# Patient Record
Sex: Male | Born: 1995 | Hispanic: No | Marital: Single | Smoking: Current every day smoker
Health system: Southern US, Community
[De-identification: ages and names within clinical notes are randomized; demographics above are authoritative.]

## PROBLEM LIST (undated history)

## (undated) DIAGNOSIS — F419 Anxiety disorder, unspecified: Secondary | ICD-10-CM

## (undated) HISTORY — DX: Anxiety disorder, unspecified: F41.9

---
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
## 2015-01-23 ENCOUNTER — Ambulatory Visit (INDEPENDENT_AMBULATORY_CARE_PROVIDER_SITE_OTHER): Payer: 59 | Admitting: Physician Assistant

## 2015-01-23 VITALS — BP 108/62 | HR 80 | Temp 97.7°F | Resp 16 | Ht 71.0 in | Wt 207.0 lb

## 2015-01-23 DIAGNOSIS — K0889 Other specified disorders of teeth and supporting structures: Secondary | ICD-10-CM | POA: Diagnosis not present

## 2015-01-23 DIAGNOSIS — R41840 Attention and concentration deficit: Secondary | ICD-10-CM

## 2015-01-23 MED ORDER — AMOXICILLIN-POT CLAVULANATE 875-125 MG PO TABS: 1.0000 | ORAL_TABLET | Freq: Two times a day (BID) | ORAL | Status: DC

## 2015-01-23 MED ORDER — IBUPROFEN 800 MG PO TABS: ORAL_TABLET | ORAL | Status: DC

## 2015-01-23 NOTE — Progress Notes (Signed)
  Medical screening examination/treatment/procedure(s) were performed by non-physician practitioner and as supervising physician I was immediately available for consultation/collaboration.     

## 2015-01-23 NOTE — Progress Notes (Signed)
01/23/2015 5:05 PM   DOB: January 22, 1996 / MRN: [id number]  SUBJECTIVE:  Evan Mann is a 19 y.o. male presenting for teeth pain that started two weeks ago and is now severe. The pain is in jaw and eminating from two areas of dental work that he had in June of this year while in [place].  Reports that he is going back in two weeks to have this work completed.  States that he is having a hard time focusing in class 2/2 to pain.  Also reports that he has had attention problems since high school and is interested in taking a medication to help with this.    Also complains of cough preceded by a sore throat, nasal congestion and sore throat.  Denies fever, chills, and nausea.  Has not tried anything for this.    He has No Known Allergies.   He  has a past medical history of Anxiety.    He  reports that he has been smoking.  He does not have any smokeless tobacco history on file. He  has no sexual activity history on file. The patient  has no past surgical history on file.  His family history is not on file.  Review of Systems  Constitutional: Negative for fever and chills.  Eyes: Negative for blurred vision.  Respiratory: Positive for cough. Negative for shortness of breath.   Cardiovascular: Negative for chest pain and leg swelling.  Gastrointestinal: Negative for nausea and abdominal pain.  Genitourinary: Negative for dysuria, urgency and frequency.  Musculoskeletal: Negative for myalgias.  Skin: Negative for rash.  Neurological: Negative for dizziness, tingling and headaches.  Psychiatric/Behavioral: Negative for depression. The patient is not nervous/anxious.     Problem list and medications reviewed and updated by myself where necessary, and exist elsewhere in the encounter.   OBJECTIVE:  BP 108/62 mmHg  Pulse 80  Temp(Src) 97.7 F (36.5 C)  Resp 16  Ht  (1.803 m)  Wt 207 lb (93.895 kg)  BMI 28.88 kg/m2  SpO2 99% CrCl cannot be calculated (Patient has no serum  creatinine result on file.).  Physical Exam  Constitutional: He is oriented to person, place, and time. He appears well-developed. He does not appear ill.  Eyes: Conjunctivae and EOM are normal. Pupils are equal, round, and reactive to light.  Cardiovascular: Normal rate and regular rhythm.   Pulmonary/Chest: Effort normal and breath sounds normal.  Abdominal: Soft. He exhibits no distension.  Musculoskeletal: Normal range of motion.  Neurological: He is alert and oriented to person, place, and time. No cranial nerve deficit. Coordination normal.  Skin: Skin is warm and dry. He is not diaphoretic.  Psychiatric: He has a normal mood and affect.  Nursing note and vitals reviewed.   No results found for this or any previous visit (from the past 48 hour(s)).  ASSESSMENT AND PLAN  Evan Mann was seen today for dental pain, cough and other.  Diagnoses and all orders for this visit:  Attention deficit: Will send to [facility] for an evaluation.  -     Ambulatory referral to Psychiatry  Pain in a tooth or teeth: Patient with visible unfinished dental work.  Will cover for an infectious etiology and treat for pain.   -     amoxicillin-clavulanate (AUGMENTIN) 875-125 MG tablet; Take 1 tablet by mouth 2 (two) times daily. -     ibuprofen (ADVIL,MOTRIN) 800 MG tablet; Take 1 tab every eight hours for pain    The  patient was advised to call or return to clinic if he does not see an improvement in symptoms or to seek the care of the closest emergency department if he worsens with the above plan.   Deliah BostonMichael Anaid Haney, MHS, PA-C [facility] 01/23/2015 5:05 PM

## 2016-04-24 ENCOUNTER — Emergency Department (HOSPITAL_COMMUNITY): Payer: BLUE CROSS/BLUE SHIELD

## 2016-04-24 ENCOUNTER — Emergency Department (HOSPITAL_COMMUNITY)
Admission: EM | Admit: 2016-04-24 | Discharge: 2016-04-24 | Disposition: A | Discharge status: Home / Self Care | Payer: BLUE CROSS/BLUE SHIELD | Attending: Emergency Medicine | Admitting: Emergency Medicine

## 2016-04-24 ENCOUNTER — Encounter (HOSPITAL_COMMUNITY): Payer: Self-pay | Admitting: Emergency Medicine

## 2016-04-24 DIAGNOSIS — R1031 Right lower quadrant pain: Secondary | ICD-10-CM | POA: Diagnosis not present

## 2016-04-24 DIAGNOSIS — Z79899 Other long term (current) drug therapy: Secondary | ICD-10-CM | POA: Insufficient documentation

## 2016-04-24 DIAGNOSIS — F17210 Nicotine dependence, cigarettes, uncomplicated: Secondary | ICD-10-CM | POA: Diagnosis not present

## 2016-04-24 LAB — COMPREHENSIVE METABOLIC PANEL
ALBUMIN: 4.2 g/dL (ref 3.5–5.0)
ALK PHOS: 62 U/L (ref 38–126)
ALT: 30 U/L (ref 17–63)
ANION GAP: 6 (ref 5–15)
AST: 26 U/L (ref 15–41)
BILIRUBIN TOTAL: 0.6 mg/dL (ref 0.3–1.2)
BUN: 17 mg/dL (ref 6–20)
CALCIUM: 9.3 mg/dL (ref 8.9–10.3)
CO2: 26 mmol/L (ref 22–32)
Chloride: 104 mmol/L (ref 101–111)
Creatinine, Ser: 0.98 mg/dL (ref 0.61–1.24)
GFR calc Af Amer: >60 mL/min (ref >60)
GLUCOSE: 89 mg/dL (ref 65–99)
Potassium: 3.8 mmol/L (ref 3.5–5.1)
Sodium: 136 mmol/L (ref 135–145)
TOTAL PROTEIN: 7 g/dL (ref 6.5–8.1)

## 2016-04-24 LAB — URINALYSIS, ROUTINE W REFLEX MICROSCOPIC
BILIRUBIN URINE: NEGATIVE
Glucose, UA: NEGATIVE mg/dL
Hgb urine dipstick: NEGATIVE
KETONES UR: NEGATIVE mg/dL
Leukocytes, UA: NEGATIVE
NITRITE: NEGATIVE
PROTEIN: NEGATIVE mg/dL
Specific Gravity, Urine: 1.012 (ref 1.005–1.030)
pH: 6 (ref 5.0–8.0)

## 2016-04-24 LAB — LIPASE, BLOOD: Lipase: 37 U/L (ref 11–51)

## 2016-04-24 LAB — CBC
HCT: 41.9 % (ref 39.0–52.0)
Hemoglobin: 15 g/dL (ref 13.0–17.0)
MCH: 30.4 pg (ref 26.0–34.0)
MCHC: 35.8 g/dL (ref 30.0–36.0)
MCV: 85 fL (ref 78.0–100.0)
Platelets: 224 10*3/uL (ref 150–400)
RBC: 4.93 MIL/uL (ref 4.22–5.81)
RDW: 12.3 % (ref 11.5–15.5)
WBC: 9.9 10*3/uL (ref 4.0–10.5)

## 2016-04-24 MED ORDER — KETOROLAC TROMETHAMINE 30 MG/ML IJ SOLN
15.0000 mg | Freq: Once | INTRAMUSCULAR | Status: AC
  Administered 2016-04-24: 15 mg via INTRAVENOUS
  Filled 2016-04-24: qty 1

## 2016-04-24 MED ORDER — IOPAMIDOL (ISOVUE-300) INJECTION 61%
100.0000 mL | Freq: Once | INTRAVENOUS | Status: AC | PRN
  Administered 2016-04-24: 100 mL via INTRAVENOUS

## 2016-04-24 MED ORDER — IOPAMIDOL (ISOVUE-300) INJECTION 61%
INTRAVENOUS | Status: AC
  Filled 2016-04-24: qty 100

## 2016-04-24 MED ORDER — SODIUM CHLORIDE 0.9 % IV BOLUS (SEPSIS)
1000.0000 mL | Freq: Once | INTRAVENOUS | Status: AC
  Administered 2016-04-24: 1000 mL via INTRAVENOUS

## 2016-04-24 NOTE — ED Notes (Addendum)
Family has questions, wants to speak with EDP prior to discharge. EDP informed, will be by to speak with family.

## 2016-04-24 NOTE — ED Notes (Signed)
Pt is c/o of intermittent abd pain that is to the RLQ and is described as cramping and occasional sharp. Pt reports that pain has been ongoing x 1 1/2 weeks, and that that he does feeling a little pressure with urination. Pt denies N/V/D.

## 2016-04-24 NOTE — ED Notes (Signed)
Pt made aware that Urine sample is needed.

## 2016-04-24 NOTE — ED Notes (Signed)
ED Provider at bedside. 

## 2016-04-24 NOTE — ED Provider Notes (Signed)
[facility]-EMERGENCY DEPT Provider Note   CSN: [id number] Arrival date & time: 04/24/16  0344     History   Chief Complaint Chief Complaint  Patient presents with  . Abdominal Pain    HPI Evan Mann is a 21 y.o. male.  HPI Patient presents with concern of pain in the right lower quadrant. Patient notes that about 10 days ago he began having pain in his lower abdomen. Since onset the pain is been persistent, with minimal superior radiation. There is no associated dysuria, hematuria. There is associated nausea, anorexia, but no vomiting, no diarrhea. No relief with OTC medication. Patient states that he is otherwise well, denies a history of surgery.  Past Medical History:  Diagnosis Date  . Anxiety     There are no active problems to display for this patient.   History reviewed. No pertinent surgical history.     Home Medications    Prior to Admission medications   Medication Sig Start Date End Date Taking? Authorizing Provider  traZODone (DESYREL) 100 MG tablet Take 100 mg by mouth at bedtime as needed for sleep.  04/19/16  Yes Historical Provider, MD    Family History No family history on file.  Social History Social History  Substance Use Topics  . Smoking status: Current Some Day Smoker    Types: Cigarettes  . Smokeless tobacco: Never Used  . Alcohol use No     Allergies   Patient has no known allergies.   Review of Systems Review of Systems  Constitutional:       Per HPI, otherwise negative  HENT:       Per HPI, otherwise negative  Respiratory:       Per HPI, otherwise negative  Cardiovascular:       Per HPI, otherwise negative  Gastrointestinal: Positive for nausea. Negative for vomiting.  Endocrine:       Negative aside from HPI  Genitourinary:       Neg aside from HPI   Musculoskeletal:       Per HPI, otherwise negative  Skin: Negative.   Neurological: Negative for syncope.     Physical Exam Updated Vital Signs BP 134/67 (BP  Location: Left Arm)   Pulse 77   Temp 98.1 F (36.7 C) (Oral)   Resp 19   Ht 6' (1.829 m)   Wt 220 lb (99.8 kg)   SpO2 95%   BMI 29.84 kg/m   Physical Exam  Constitutional: He is oriented to person, place, and time. He appears well-developed. No distress.  HENT:  Head: Normocephalic and atraumatic.  Eyes: Conjunctivae and EOM are normal.  Cardiovascular: Normal rate and regular rhythm.   Pulmonary/Chest: Effort normal. No stridor. No respiratory distress.  Abdominal: He exhibits no distension.  Tenderness to palpation with guarding throughout the right lower  Musculoskeletal: He exhibits no edema.  Neurological: He is alert and oriented to person, place, and time.  Skin: Skin is warm and dry.  Psychiatric: He has a normal mood and affect.  Nursing note and vitals reviewed.    ED Treatments / Results  Labs (all labs ordered are listed, but only abnormal results are displayed) Labs Reviewed  URINALYSIS, ROUTINE W REFLEX MICROSCOPIC - Abnormal; Notable for the following:       Result Value   Color, Urine STRAW (*)    All other components within normal limits  LIPASE, BLOOD  COMPREHENSIVE METABOLIC PANEL  CBC    Radiology Ct Abdomen Pelvis W Contrast  Result  Date: 04/24/2016 CLINICAL DATA:  Right lower quadrant pain. EXAM: CT ABDOMEN AND PELVIS WITH CONTRAST TECHNIQUE: Multidetector CT imaging of the abdomen and pelvis was performed using the standard protocol following bolus administration of intravenous contrast. CONTRAST:  100mL ISOVUE-300 IOPAMIDOL (ISOVUE-300) INJECTION 61% COMPARISON:  None. FINDINGS: Lower chest: No acute abnormality. Hepatobiliary: No focal liver abnormality is seen. No gallstones, gallbladder wall thickening, or biliary dilatation. Contracted gallbladder. Pancreas: Unremarkable. No pancreatic ductal dilatation or surrounding inflammatory changes. Spleen: Normal in size without focal abnormality. Adrenals/Urinary Tract: Adrenal glands are unremarkable.  Kidneys are normal, without renal calculi, focal lesion, or hydronephrosis. Bladder is unremarkable. Stomach/Bowel: No bowel wall thickening or bowel dilatation. No pneumatosis, pneumoperitoneum or portal venous gas. Normal appendix. Moderate amount of stool throughout the colon. No abdominal or pelvic free fluid. Vascular/Lymphatic: Normal caliber abdominal aorta. No lymphadenopathy. Reproductive: Prostate is unremarkable. Other: No abdominal wall hernia or abnormality. No abdominopelvic ascites. Musculoskeletal: No acute osseous abnormality. No lytic or sclerotic osseous lesion. IMPRESSION: 1. No acute abdominal or pelvic pathology. 2. Normal appendix. 3. Moderate amount of stool throughout the colon. Electronically Signed   By: Elige KoHetal  Patel   On: 04/24/2016 08:51    Procedures Procedures (including critical care time)  Medications Ordered in ED Medications  iopamidol (ISOVUE-300) 61 % injection (not administered)  ketorolac (TORADOL) 30 MG/ML injection 15 mg (15 mg Intravenous Given 04/24/16 0750)  sodium chloride 0.9 % bolus 1,000 mL (1,000 mLs Intravenous New Bag/Given 04/24/16 0750)  iopamidol (ISOVUE-300) 61 % injection 100 mL (100 mLs Intravenous Contrast Given 04/24/16 0817)     Initial Impression / Assessment and Plan / ED Course  I have reviewed the triage vital signs and the nursing notes.  Pertinent labs & imaging results that were available during my care of the patient were reviewed by me and considered in my medical decision making (see chart for details).  9:06 AM Patient in no distress, sleeping. He awakens easily, is aware of all findings. With reassuring labs, urinalysis, CT scan, low evidence for acute new abdominal pathology. Patient may have musculoskeletal etiology or other subtle GI cause, but with no evidence for obstruction, infection, and with the aforementioned reassuring findings, patient appropriate for further evaluation, management as an outpatient. Patient  provided GI referral.   Final Clinical Impressions(s) / ED Diagnoses  Abdominal pain, right lower quadrant   Gerhard Munchobert Skyleigh Windle, MD 04/24/16 [phone]

## 2016-04-24 NOTE — ED Triage Notes (Signed)
Pt c/o right-sided abdominal pain that began about 2 weeks ago and worsened this am; pt describes pain as "burning and swelling"; denies N/V/D; Last BM today

## 2016-04-24 NOTE — Discharge Instructions (Signed)
As discussed, your evaluation today has been largely reassuring.  But, it is important that you monitor your condition carefully, and do not hesitate to return to the ED if you develop new, or concerning changes in your condition. ? ?Otherwise, please follow-up with your physician for appropriate ongoing care. ? ?

## 2016-12-02 ENCOUNTER — Emergency Department (HOSPITAL_COMMUNITY): Payer: BLUE CROSS/BLUE SHIELD

## 2016-12-02 ENCOUNTER — Encounter (HOSPITAL_COMMUNITY): Payer: Self-pay | Admitting: Emergency Medicine

## 2016-12-02 ENCOUNTER — Emergency Department (HOSPITAL_COMMUNITY)
Admission: EM | Admit: 2016-12-02 | Discharge: 2016-12-02 | Disposition: A | Discharge status: Home / Self Care | Payer: BLUE CROSS/BLUE SHIELD | Attending: Emergency Medicine | Admitting: Emergency Medicine

## 2016-12-02 DIAGNOSIS — F1910 Other psychoactive substance abuse, uncomplicated: Secondary | ICD-10-CM | POA: Diagnosis not present

## 2016-12-02 DIAGNOSIS — F17210 Nicotine dependence, cigarettes, uncomplicated: Secondary | ICD-10-CM | POA: Diagnosis not present

## 2016-12-02 DIAGNOSIS — R443 Hallucinations, unspecified: Secondary | ICD-10-CM | POA: Diagnosis present

## 2016-12-02 LAB — COMPREHENSIVE METABOLIC PANEL
ALBUMIN: 4.4 g/dL (ref 3.5–5.0)
ALT: 28 U/L (ref 17–63)
ANION GAP: 11 (ref 5–15)
AST: 27 U/L (ref 15–41)
Alkaline Phosphatase: 80 U/L (ref 38–126)
BILIRUBIN TOTAL: 0.5 mg/dL (ref 0.3–1.2)
BUN: 13 mg/dL (ref 6–20)
CO2: 27 mmol/L (ref 22–32)
Calcium: 9.6 mg/dL (ref 8.9–10.3)
Chloride: 101 mmol/L (ref 101–111)
Creatinine, Ser: 0.9 mg/dL (ref 0.61–1.24)
GFR calc Af Amer: >60 mL/min (ref >60)
GFR calc non Af Amer: >60 mL/min (ref >60)
GLUCOSE: 123 mg/dL — AB (ref 65–99)
POTASSIUM: 3.8 mmol/L (ref 3.5–5.1)
SODIUM: 139 mmol/L (ref 135–145)
TOTAL PROTEIN: 7.7 g/dL (ref 6.5–8.1)

## 2016-12-02 LAB — URINALYSIS, DIPSTICK ONLY
BILIRUBIN URINE: NEGATIVE
Glucose, UA: NEGATIVE mg/dL
HGB URINE DIPSTICK: NEGATIVE
KETONES UR: NEGATIVE mg/dL
Leukocytes, UA: NEGATIVE
NITRITE: NEGATIVE
PROTEIN: NEGATIVE mg/dL
Specific Gravity, Urine: 1.01 (ref 1.005–1.030)
pH: 6 (ref 5.0–8.0)

## 2016-12-02 LAB — CBC WITH DIFFERENTIAL/PLATELET
BASOS ABS: 0 10*3/uL (ref 0.0–0.1)
BASOS PCT: 0 %
EOS ABS: 0.1 10*3/uL (ref 0.0–0.7)
Eosinophils Relative: 1 %
HEMATOCRIT: 43.2 % (ref 39.0–52.0)
HEMOGLOBIN: 15.6 g/dL (ref 13.0–17.0)
Lymphocytes Relative: 17 %
Lymphs Abs: 1.9 10*3/uL (ref 0.7–4.0)
MCH: 30.6 pg (ref 26.0–34.0)
MCHC: 36.1 g/dL — AB (ref 30.0–36.0)
MCV: 84.7 fL (ref 78.0–100.0)
MONO ABS: 0.8 10*3/uL (ref 0.1–1.0)
Monocytes Relative: 7 %
NEUTROS ABS: 8.1 10*3/uL — AB (ref 1.7–7.7)
NEUTROS PCT: 75 %
Platelets: 230 10*3/uL (ref 150–400)
RBC: 5.1 MIL/uL (ref 4.22–5.81)
RDW: 12.3 % (ref 11.5–15.5)
WBC: 10.9 10*3/uL — ABNORMAL HIGH (ref 4.0–10.5)

## 2016-12-02 LAB — RAPID URINE DRUG SCREEN, HOSP PERFORMED
AMPHETAMINES: NOT DETECTED
BARBITURATES: NOT DETECTED
BENZODIAZEPINES: NOT DETECTED
Cocaine: NOT DETECTED
Opiates: POSITIVE — AB
TETRAHYDROCANNABINOL: POSITIVE — AB

## 2016-12-02 LAB — CBG MONITORING, ED: GLUCOSE-CAPILLARY: 119 mg/dL — AB (ref 65–99)

## 2016-12-02 LAB — I-STAT CG4 LACTIC ACID, ED: LACTIC ACID, VENOUS: 0.88 mmol/L (ref 0.5–1.9)

## 2016-12-02 MED ORDER — NALOXONE HCL 4 MG/0.1ML NA LIQD
1.0000 | Freq: Once | NASAL | Status: AC | PRN
  Administered 2016-12-02: 1 via NASAL
  Filled 2016-12-02: qty 4

## 2016-12-02 NOTE — BH Assessment (Signed)
Assessment Note   Patient Name: Evan Mann MRN: [id number] Referring Physician: Ethelda Chick Location of Patient: [place]  Location of Provider: Other: Sue Lush is an 21 y.o. male who came to the ED with an altered mental status by mom who was concerned by his presentation. Mom states that pt was "hallucinating last night and threatening to harm himself and her." She believes that he smoked marijuana "laced with K2".   Pt discloses to Clinical research associate that he is using heroin and has been using for 8 months. He states that he started using oxycodone when he had a tooth injury and graduated to heroin after using oxy for a few months. He states that he snorts heroin daily but his mom does not know this. His mom thinks that he only uses marijuana. Pt currently lives with mom and goes to school at [employer]. Pt denies SI, HI or AVH today but does state he needs help for his addiction. Pt states that he was taking suboxone for a couple of months at [facility] clinic but is no longer going there. He has never had any other substance abuse treatment or inpatient psychiatric treatment. Pt has never had a suicide attempt.   Pt does not meet criteria for inpatient psychiatric admission per Elta Guadeloupe NP. Outpatient and residential rehab resources have been given to patient for follow up. Peer support has also spoken to the patient about his options for substance abuse treatment.   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 Anxiety     History reviewed. No pertinent surgical history.  Family History: History reviewed. No pertinent family history.  Social History:  reports that he has been smoking Cigarettes.  He has never used smokeless tobacco. He reports that he does not drink alcohol or use drugs.  Additional Social History:  Alcohol / Drug Use Pain Medications: pt started abusing pain  meds then moved to heroin History of alcohol / drug use?: Yes Substance #1 Name of Substance 1: Heroin 1 - Age of First Use: 20 1 - Amount (size/oz): .2 of a gram 1 - Frequency: daily 1 - Duration: 8 months 1 - Last Use / Amount: using daily  Substance #2 Name of Substance 2: Marijuana   CIWA: CIWA-Ar BP: 111/66 Pulse Rate: 61 COWS:    PATIENT STRENGTHS: (choose at least two) Average or above average intelligence General fund of knowledge Supportive family/friends  Allergies: No Known Allergies  Home Medications:  (Not in a hospital admission)  OB/GYN Status:  No LMP for male patient.  General Assessment Data [facility] of [facility]: [facility] TTS Assessment: In system Is this a Tele or Face-to-Face Assessment?: Face-to-Face Is this an Initial Assessment or a Re-assessment for this encounter?: Initial Assessment Marital status: Single Is patient pregnant?: No Pregnancy Status: No Living Arrangements: Parent Can pt return to current living arrangement?: No Admission Status: Involuntary Is patient capable of signing voluntary admission?: Yes Referral Source: Self/Family/Friend Insurance type: BCBS      Crisis Care Plan Living Arrangements: Parent Name of Psychiatrist: None Name of Therapist: None  Education Status Is patient currently in school?: Yes Name of school: [employer]   Risk to self with the past 6 months Suicidal Ideation: No Has patient been a risk to self within the past 6 months prior to admission? : No Suicidal Intent: No Has patient had any suicidal intent within the  past 6 months prior to admission? : No Is patient at risk for suicide?: No Suicidal Plan?: No Has patient had any suicidal plan within the past 6 months prior to admission? : No Access to Means: No What has been your use of drugs/alcohol within the last 12 months?: using heroin Previous Attempts/Gestures: No How many times?: 0 Other Self Harm Risks: none Triggers for Past  Attempts: None known Intentional Self Injurious Behavior: None Family Suicide History: No Recent stressful life event(s): Conflict (Comment) Persecutory voices/beliefs?: No Depression: Yes Depression Symptoms: Despondent Substance abuse history and/or treatment for substance abuse?: No Suicide prevention information given to non-admitted patients: Not applicable  Risk to Others within the past 6 months Homicidal Ideation: No Does patient have any lifetime risk of violence toward others beyond the six months prior to admission? : No Thoughts of Harm to Others: No Current Homicidal Intent: No Current Homicidal Plan: No Access to Homicidal Means: No Identified Victim: none History of harm to others?: No Assessment of Violence: None Noted Violent Behavior Description: none Does patient have access to weapons?: No Criminal Charges Pending?: No Does patient have a court date: No Is patient on probation?: No  Psychosis Hallucinations: None noted Delusions: None noted  Mental Status Report Appearance/Hygiene: Disheveled Eye Contact: Poor Motor Activity: Freedom of movement Speech: Slurred, Slow Level of Consciousness: Quiet/awake, Drowsy, Sedated Mood: Depressed Affect: Depressed Anxiety Level: None Thought Processes: Coherent Judgement: Impaired Orientation: Place, Person, Time, Situation Obsessive Compulsive Thoughts/Behaviors: None  Cognitive Functioning Concentration: Normal Memory: Recent Intact, Remote Intact IQ: Average Insight: Poor Impulse Control: Poor Appetite: Poor Weight Loss: 0 Weight Gain: 0 Sleep: No Change Total Hours of Sleep: 8 Vegetative Symptoms: None  ADLScreening [facility] Assessment Services) Patient's cognitive ability adequate to safely complete daily activities?: Yes Patient able to express need for assistance with ADLs?: Yes Independently performs ADLs?: Yes (appropriate for developmental age)  Prior Inpatient Therapy Prior Inpatient  Therapy: No  Prior Outpatient Therapy Prior Outpatient Therapy: Yes Prior Therapy Dates: 2018 [facility] Reason for Treatment: getting suboxone Does patient have an ACCT team?: No Does patient have Intensive In-House Services?  : No Does patient have [facility] services? : No Does patient have P4CC services?: No  ADL Screening (condition at time of admission) Patient's cognitive ability adequate to safely complete daily activities?: Yes Is the patient deaf or have difficulty hearing?: No Does the patient have difficulty seeing, even when wearing glasses/contacts?: No Does the patient have difficulty concentrating, remembering, or making decisions?: No Patient able to express need for assistance with ADLs?: Yes Does the patient have difficulty dressing or bathing?: No Independently performs ADLs?: Yes (appropriate for developmental age) Does the patient have difficulty walking or climbing stairs?: No Weakness of Legs: None Weakness of Arms/Hands: None  Home Assistive Devices/Equipment Home Assistive Devices/Equipment: None  Therapy Consults (therapy consults require a physician order) PT Evaluation Needed: No OT Evalulation Needed: No SLP Evaluation Needed: No Abuse/Neglect Assessment (Assessment to be complete while patient is alone) Physical Abuse: Denies Verbal Abuse: Denies Sexual Abuse: Denies Exploitation of patient/patient's resources: Denies Self-Neglect: Denies Values / Beliefs Cultural Requests During Hospitalization: None Spiritual Requests During Hospitalization: None Consults Spiritual Care Consult Needed: No Social Work Consult Needed: No Merchant navy officerAdvance Directives (For Healthcare) Does Patient Have a Medical Advance Directive?: No Would patient like information on creating a medical advance directive?: No - Patient declined Nutrition Screen- [facility] Patient's home diet: Regular Has the patient recently lost  weight without  trying?: No Has the patient been eating poorly because of a decreased appetite?: No Malnutrition Screening Tool Score: 0  Additional Information 1:1 In Past 12 Months?: No CIRT Risk: No Elopement Risk: No Does patient have medical clearance?: Yes     Disposition:  Disposition Initial Assessment Completed for this Encounter: Yes Disposition of Patient: Outpatient treatment Type of outpatient treatment: Adult    Jarrett Ables [facility], LCAS  12/02/2016 2:11 PM

## 2016-12-02 NOTE — ED Triage Notes (Signed)
Pt brought in by his mother this morning from home  Mother states last night around 7pm the pt became very incoherent, agitated, paranoid, just not acting right  States he did not sleep last night  Pt alert in triage but not following commands and seems very agitated

## 2016-12-02 NOTE — BH Assessment (Signed)
[facility]  Per Laveda AbbeLaurie Britton Parks, FNP, this pt does not require psychiatric hospitalization at this time.  [facility] information for area substance abuse treatment programs.  This has been included in pt's discharge instructions.  Pt would also benefit from seeing Peer Support Specialists; they will be asked to speak to pt.  Pt's nurse has been notified.  Doylene Canninghomas Fianna Snowball, MA Triage Specialist [phone]

## 2016-12-02 NOTE — ED Notes (Signed)
Pt's mother continues to ask questions and try to get medical information.  Made aware we could not give her test results.  Pt's mother want the Pt "[facility]  Mother made aware that this isn't an option and it was suggested that someone stay with him until his appointment.

## 2016-12-02 NOTE — ED Notes (Signed)
Pt's mother reports "he was very suicidal and threatening me. There was obviously something else going on."  Sts she was "forgot" to tell us earlier.    Primary RN reports the mother's story keeps changing.

## 2016-12-02 NOTE — Discharge Instructions (Addendum)
To help you maintain a sober lifestyle, a substance abuse treatment program may be beneficial to you.  Contact one of the following facilities at your earliest opportunity to ask about enrolling:       [facility]      Contact person: Evan BalesChristy Mann      [phone]       [facility]      [address]      [place], [place] [address]      [phone]       [facility] [place]      [address]      [place], [place] [id number]      [phone]      Contact any of the numbers in order to get help with your drug problem. Carry Narcan with you at all times and instruct your family and friends how to use it. If you have any thought of harming yourself or someone else call911 immediately

## 2016-12-02 NOTE — ED Notes (Signed)
ED Provider at bedside. 

## 2016-12-02 NOTE — Patient Outreach (Signed)
ED Peer Support Specialist Patient Intake (Complete at intake & 30-60 Day Follow-up)  Name: Evan Mann  MRN: [id number]  Age: 21 y.o.   Date of Admission: 12/02/2016  Intake: Initial Comments:      Primary Reason Admitted: poly substance use with opioids/marijuana  Lab values: Alcohol/ETOH:   Positive UDS? (P) Yes Amphetamines: (P) No Barbiturates: (P) No Benzodiazepines: (P) No Cocaine: (P) No Opiates: (P) Yes Cannabinoids: (P) Yes  Demographic information: Gender: (P) Male Ethnicity:   Marital Status: (P) Single Insurance Status: (P) Media plannerrivate Insurance (Blue Cross Blue Shield) Control and instrumentation engineereceives non-medical governmental assistance (Work Engineer, agriculturalirst/Welfare, food stamps, etc.: (P) No Lives with: (P) Parent (Mom) Living situation: (P) House/Apartment  Reported Patient History: Patient reported health conditions: (P) None Patient aware of HIV and hepatitis status: (P) Yes (comment) (Negative)  In past year, has patient visited ED for any reason? (P) Yes  Number of ED visits: (P) 1  Reason(s) for visit: (P) pain on side  In past year, has patient been hospitalized for any reason? (P) No  Number of hospitalizations:    Reason(s) for hospitalization:    In past year, has patient been arrested? (P) No  Number of arrests:    Reason(s) for arrest:    In past year, has patient been incarcerated? (P) No  Number of incarcerations:    Reason(s) for incarceration:    In past year, has patient received medication-assisted treatment? (P) No  In past year, patient received the following treatments:    In past year, has patient received any harm reduction services? (P) No  Did this include any of the following?    In past year, has patient received care from a mental health provider for diagnosis other than SUD? (P) No  In past year, is this first time patient has overdosed? (P) No  Number of past overdoses: (P) 0  In past year, is this first time patient has been hospitalized for  an overdose?    Number of hospitalizations for overdose(s): (P) 0  Is patient currently receiving treatment for a mental health diagnosis? (P) No  Patient reports experiencing difficulty participating in SUD treatment: (P) No    Most important reason(s) for this difficulty?    Has patient received prior services for treatment? (P) No  In past, patient has received services from following agencies:    Plan of Care:  Suggested follow up at these agencies/treatment centers: (P)  (Outpatient programs: [facility] or [facility])  Other information:    Bartholomew BoardsJohn Alyxandra Tenbrink, CPSS  12/02/2016 2:38 PM

## 2016-12-02 NOTE — ED Notes (Signed)
This writer attempted to call and ask the patient about his peripheral iv removal. Attempted to call 3 times.

## 2016-12-02 NOTE — Progress Notes (Signed)
  Pt was seen and chart reviewed with treatment team and Dr Lucianne MussKumar. Pt denies suicidal and homicidal ideation and denies auditory and visual hallucinations. Pt has a history of substance abuse, UDS positive for THC and Opiates, BAL negative.  Pt is interested in outpatient rehabilitation services. Case discussed with Dr Lucianne MussKumar who recommends that patient be seen by Peer Support and then discharged.   Dr Ethelda ChickJacubowitz made aware of disposition.   Laveda AbbeLaurie Britton Ridhaan Dreibelbis, NP-C 12/02/2016     1351

## 2016-12-02 NOTE — ED Provider Notes (Signed)
[facility]-EMERGENCY DEPT Provider Note   CSN: [id number] Arrival date & time: 12/02/16  0702     History   Chief Complaint Chief Complaint  Patient presents with  . Altered Mental Status  level V caveat altered mental status  HPI Evan Mann is a 21 y.o. male.  HPI Patient brought by his mother in private vehicle. He's had cough for approximately a week. Last night he became agitated, "hallucinating", and paranoid. He didn't sleep last nightand has had minimal sleep over the past several nights. His mother suspects that he is using illicit drugs. Patient denies illicit drug use. No treatment prior to coming here. He told his mother that he may have had a fever. He denies pain anywhere.. No treatment prior to coming here. He vomited last week but not since. He was acting normally until yesterday. Past Medical History:  Diagnosis Date  . Anxiety     There are no active problems to display for this patient.   History reviewed. No pertinent surgical history.     Home Medications    Prior to Admission medications   Medication Sig Start Date End Date Taking? Authorizing Provider  traZODone (DESYREL) 100 MG tablet Take 100 mg by mouth at bedtime as needed for sleep.  04/19/16   [provider]    Family History History reviewed. No pertinent family history.  Social History Social History  Substance Use Topics  . Smoking status: Current Every Day Smoker    Types: Cigarettes  . Smokeless tobacco: Never Used  . Alcohol use No   No illicit drug use to his mother's knowledge. Patient denes illicit drug use  Allergies   Patient has no known allergies.   Review of Systems Review of Systems  Unable to perform ROS: Mental status change  Respiratory: Positive for cough.      Physical Exam Updated Vital Signs BP 120/87 (BP Location: Left Arm)   Pulse (!) 112   Temp 100.1 F (37.8 C) (Oral)   Resp 20   SpO2 97%   Physical Exam    Constitutional: He appears well-developed and well-nourished.  Ill-appearing  HENT:  Head: Normocephalic and atraumatic.  Eyes: Pupils are equal, round, and reactive to light. Conjunctivae are normal.  Pupils 2 mm reactive to light bilaterally  Neck: Neck supple. No tracheal deviation present. No thyromegaly present.  No signs of meningitis  Cardiovascular: Regular rhythm.   No murmur heard. Mildly tachycardic  Pulmonary/Chest: Effort normal and breath sounds normal.  Abdominal: Soft. Bowel sounds are normal. He exhibits no distension. There is no tenderness.  Musculoskeletal: Normal range of motion. He exhibits no edema or tenderness.  Neurological: He is alert. Coordination normal.  Follow simple commands moves all extremities tenderness 2 through 12 grossly intact. DTRs symmetric bilaterally knee jerk ankle jerk and biceps was going bilaterally motor strength 5 over 5 overall  Skin: Skin is warm and dry. No rash noted.  Psychiatric: He has a normal mood and affect.  Nursing note and vitals reviewed.   Chest x-ray viewed by me ED Treatments / Results  Labs (all labs ordered are listed, but only abnormal results are displayed) Labs Reviewed  CBG MONITORING, ED - Abnormal; Notable for the following:       Result Value   Glucose-Capillary 119 (*)    All other components within normal limits    EKG  EKG Interpretation  Date/Time:  Thursday December 02 2016 07:29:55 EDT Ventricular Rate:  94 PR  Interval:    QRS Duration: 89 QT Interval:  346 QTC Calculation: 433 R Axis:   48 Text Interpretation:  Sinus rhythm Probable left atrial enlargement No old tracing to compare Confirmed by Doug Sou [phone]) on 12/02/2016 7:39:08 AM      Results for orders placed or performed during the hospital encounter of 12/02/16  CBC with Differential/Platelet  Result Value Ref Range   WBC 10.9 (H) 4.0 - 10.5 K/uL   RBC 5.10 4.22 - 5.81 MIL/uL   Hemoglobin 15.6 13.0 - 17.0 g/dL   HCT  19.1 47.8 - 29.5 %   MCV 84.7 78.0 - 100.0 fL   MCH 30.6 26.0 - 34.0 pg   MCHC 36.1 (H) 30.0 - 36.0 g/dL   RDW 62.1 30.8 - 65.7 %   Platelets 230 150 - 400 K/uL   Neutrophils Relative % 75 %   Neutro Abs 8.1 (H) 1.7 - 7.7 K/uL   Lymphocytes Relative 17 %   Lymphs Abs 1.9 0.7 - 4.0 K/uL   Monocytes Relative 7 %   Monocytes Absolute 0.8 0.1 - 1.0 K/uL   Eosinophils Relative 1 %   Eosinophils Absolute 0.1 0.0 - 0.7 K/uL   Basophils Relative 0 %   Basophils Absolute 0.0 0.0 - 0.1 K/uL  Comprehensive metabolic panel  Result Value Ref Range   Sodium 139 135 - 145 mmol/L   Potassium 3.8 3.5 - 5.1 mmol/L   Chloride 101 101 - 111 mmol/L   CO2 27 22 - 32 mmol/L   Glucose, Bld 123 (H) 65 - 99 mg/dL   BUN 13 6 - 20 mg/dL   Creatinine, Ser 8.46 0.61 - 1.24 mg/dL   Calcium 9.6 8.9 - 96.2 mg/dL   Total Protein 7.7 6.5 - 8.1 g/dL   Albumin 4.4 3.5 - 5.0 g/dL   AST 27 15 - 41 U/L   ALT 28 17 - 63 U/L   Alkaline Phosphatase 80 38 - 126 U/L   Total Bilirubin 0.5 0.3 - 1.2 mg/dL   GFR calc non Af Amer >60 >60 mL/min   GFR calc Af Amer >60 >60 mL/min   Anion gap 11 5 - 15  Rapid urine drug screen (hospital performed)  Result Value Ref Range   Opiates POSITIVE (A) NONE DETECTED   Cocaine NONE DETECTED NONE DETECTED   Benzodiazepines NONE DETECTED NONE DETECTED   Amphetamines NONE DETECTED NONE DETECTED   Tetrahydrocannabinol POSITIVE (A) NONE DETECTED   Barbiturates NONE DETECTED NONE DETECTED  Urinalysis, dipstick only  Result Value Ref Range   Color, Urine YELLOW YELLOW   APPearance CLEAR CLEAR   Specific Gravity, Urine 1.010 1.005 - 1.030   pH 6.0 5.0 - 8.0   Glucose, UA NEGATIVE NEGATIVE mg/dL   Hgb urine dipstick NEGATIVE NEGATIVE   Bilirubin Urine NEGATIVE NEGATIVE   Ketones, ur NEGATIVE NEGATIVE mg/dL   Protein, ur NEGATIVE NEGATIVE mg/dL   Nitrite NEGATIVE NEGATIVE   Leukocytes, UA NEGATIVE NEGATIVE  CBG monitoring, ED  Result Value Ref Range   Glucose-Capillary 119 (H) 65  - 99 mg/dL   Dg Chest 2 View  Result Date: 12/02/2016 CLINICAL DATA:  Per mother last night approx 1900hrs pt became incoherent, agitated, not acting right and this a.m continues to have altered mental status, unresponsive to questions EXAM: CHEST  2 VIEW COMPARISON:  None. FINDINGS: The heart size and mediastinal contours are within normal limits. Both lungs are clear. The visualized skeletal structures are unremarkable. IMPRESSION: No active cardiopulmonary disease.  Electronically Signed   By: Elige KoHetal  Patel   On: 12/02/2016 08:57   Ct Head Wo Contrast  Result Date: 12/02/2016 CLINICAL DATA:  Altered level of consciousness.  No reported injury. EXAM: CT HEAD WITHOUT CONTRAST TECHNIQUE: Contiguous axial images were obtained from the base of the skull through the vertex without intravenous contrast. COMPARISON:  None. FINDINGS: Brain: No evidence of parenchymal hemorrhage or extra-axial fluid collection. No mass lesion, mass effect, or midline shift. No CT evidence of acute infarction. Cerebral volume is age appropriate. No ventriculomegaly. Vascular: No acute abnormality. Skull: No evidence of calvarial fracture. Sinuses/Orbits: Mucoperiosteal thickening and partial opacification of the right maxillary sinus and right ethmoidal air cells. No fluid levels. Other:  The mastoid air cells are unopacified. IMPRESSION: 1.  No evidence of acute intracranial abnormality. 2. Right paranasal sinusitis, probably chronic. Electronically Signed   By: Delbert PhenixJason A Poff M.D.   On: 12/02/2016 10:07   Radiology No results found.  Procedures Procedures (including critical care time)  Medications Ordered in ED Medications - No data to display  9:10 AM mother reports to me that son had admitted to her that he used synthetic marijuana know as K2 Initial Impression / Assessment and Plan / ED Course  I have reviewed the triage vital signs and the nursing notes.  Pertinent labs & imaging results that were available  during my care of the patient were reviewed by me and considered in my medical decision making (see chart for details).     Chest x-ray viewed by me 10:50 AM patient is somewhat sleepy easily arousable answers questions appropriately. Gait slightly unsteady appropriate [place] Coma Score 14. He admits to snorting heroin recently.denies IV drug use.  2:40 PM patient is alert and relates without difficulty not lightheaded on standing. [place] Coma Score 15. Patient's mother reports the patient expressed thoughts of suicide and of harming her last night. Patient vehemently denies that. I've consulted PTS and psychiatry who have evaluated the patient and don't deem him appropriate for inpatient stay. . Support Counseling has also been consulted and evaluated patient in ED. He will be given Narcan to go and referred to outpatient resources for substance abuse Final Clinical Impressions(s) / ED Diagnoses  Diagnosis polysubstance abuse Final diagnoses:  None    New Prescriptions New Prescriptions   No medications on file     Doug SouJacubowitz, Silveria Botz, MD 12/02/16 1455

## 2018-04-26 IMAGING — CT CT HEAD W/O CM
3 of 4 series · 15 of 47 positions shown, 18 images · non-contrast
Comparison: None.

CLINICAL DATA: Altered level of consciousness.  No reported injury.

EXAM:
CT HEAD WITHOUT CONTRAST
TECHNIQUE: Contiguous axial images were obtained from the base of the skull
through the vertex without intravenous contrast.

[Series 2: head w/o · axial · non-contrast · 0.45mm/px · z∈[-95,+25]mm · 9 of 32 slices shown, 12 images]
[im 4/32  brain]
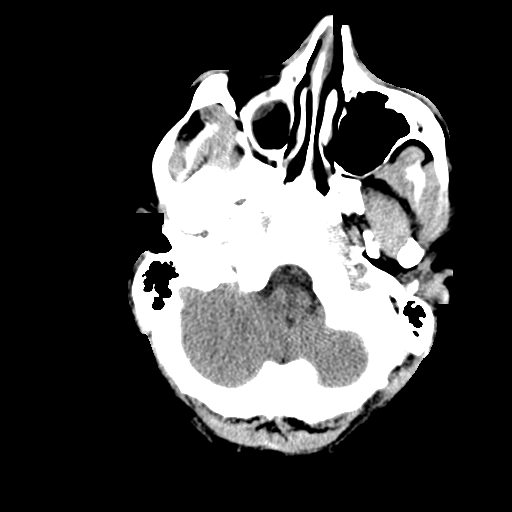
[im 4/32  bone]
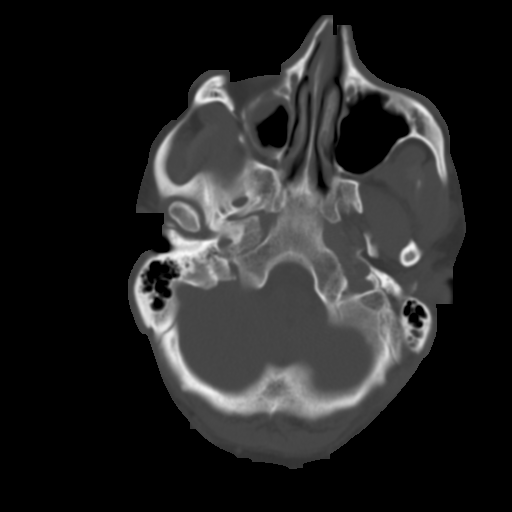
[im 7/32  brain]
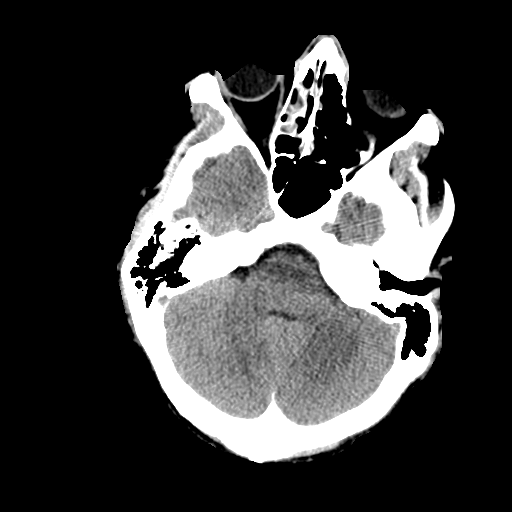
[im 10/32  brain]
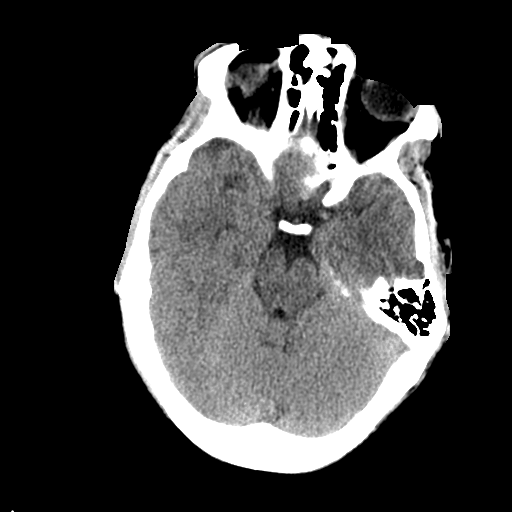
[im 13/32  brain]
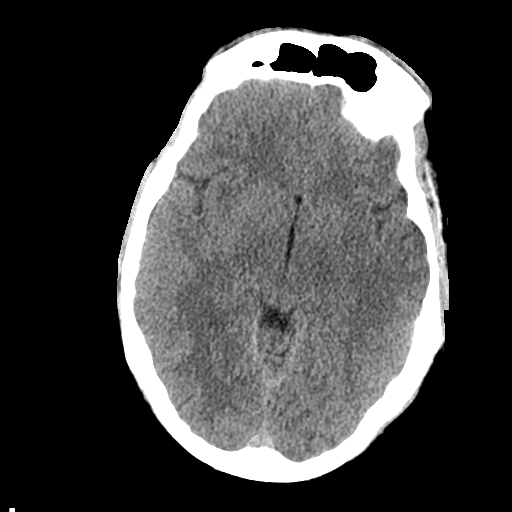
[im 16/32  brain]
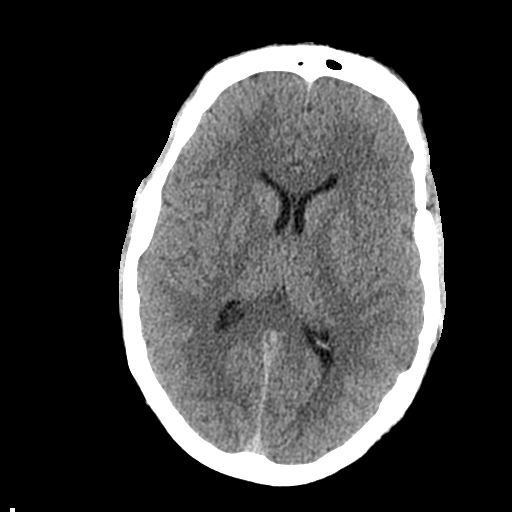
[im 16/32  bone]
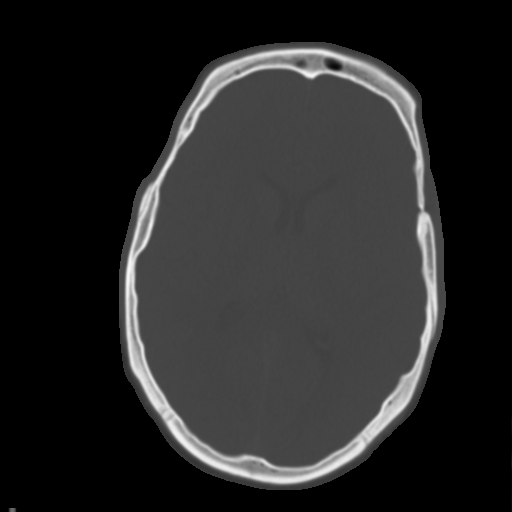
[im 19/32  brain]
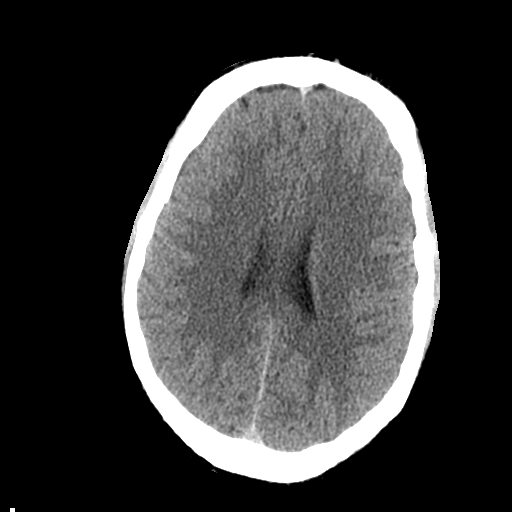
[im 22/32  brain]
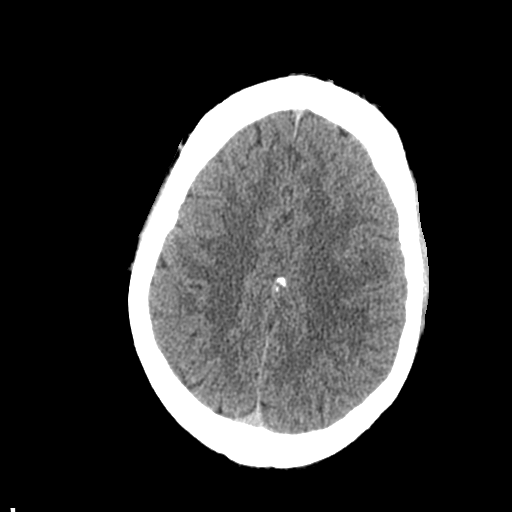
[im 25/32  brain]
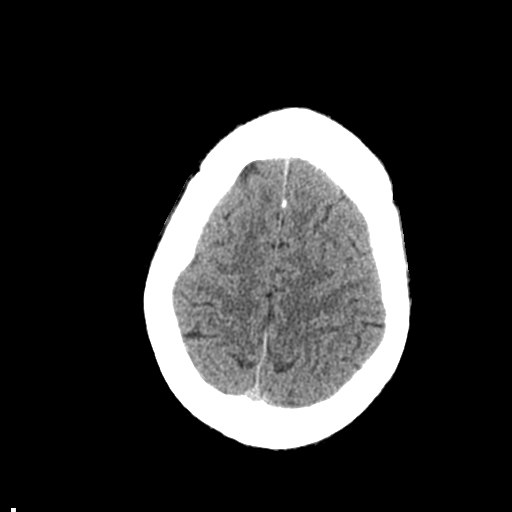
[im 28/32  brain]
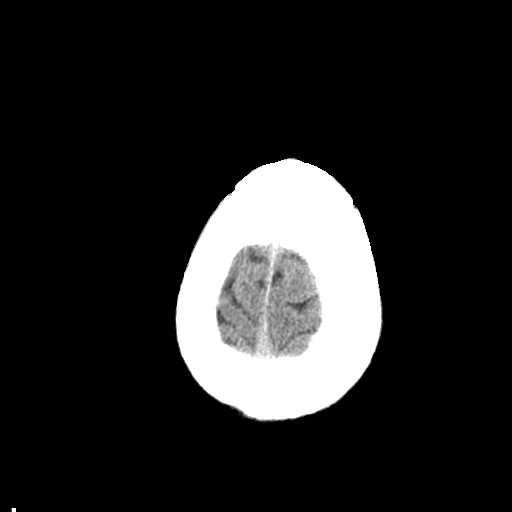
[im 28/32  bone]
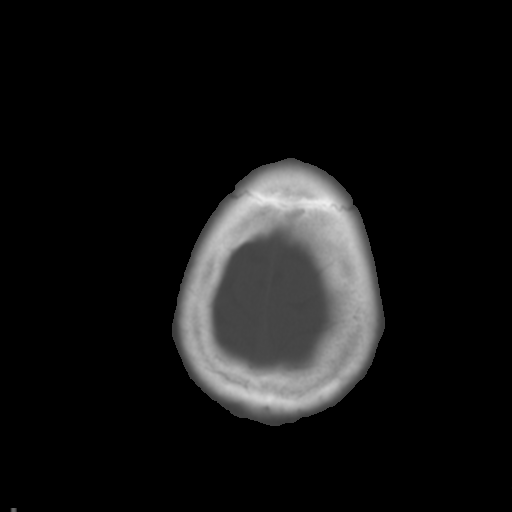

[Series 5: coronal · coronal · 0.31mm/px · 3 of 72 slices shown]
[im 24/72  brain]
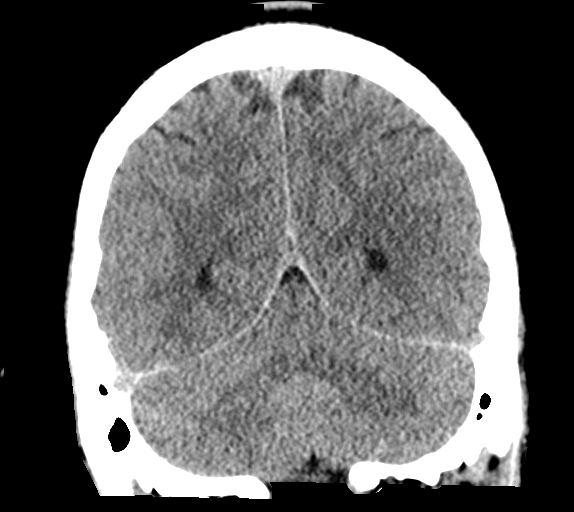
[im 32/72  brain]
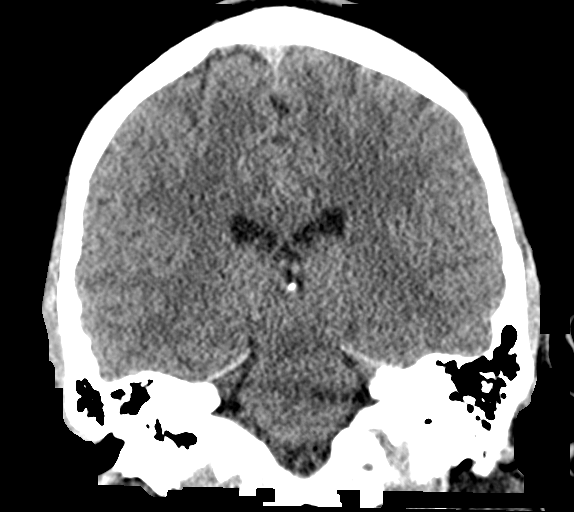
[im 40/72  brain]
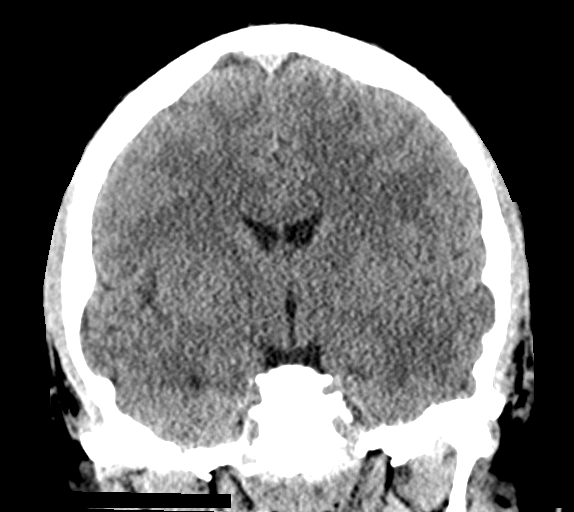

[Series 6: sagittal · sagittal · 0.31mm/px · 3 of 59 slices shown]
[im 20/59  brain]
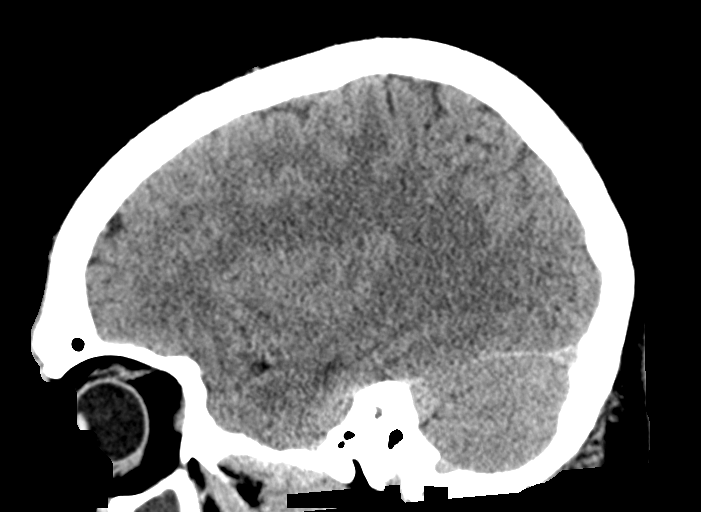
[im 30/59  brain]
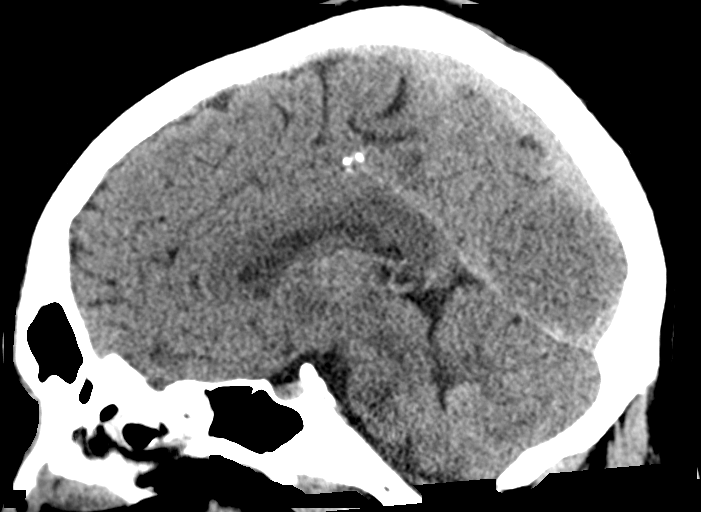
[im 39/59  brain]
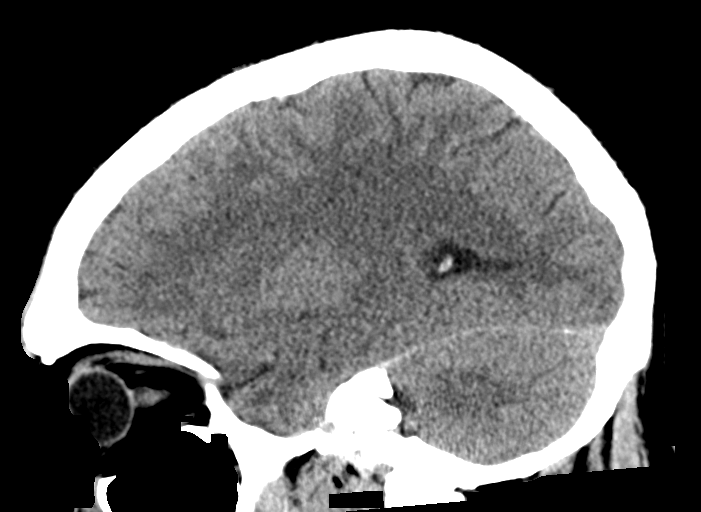

[15 of 47 positions shown; findings below may reference images not displayed]

FINDINGS: Brain: No evidence of parenchymal hemorrhage or extra-axial fluid
collection. No mass lesion, mass effect, or midline shift. No CT
evidence of acute infarction. Cerebral volume is age appropriate. No
ventriculomegaly.

Vascular: No acute abnormality.

Skull: No evidence of calvarial fracture.

Sinuses/Orbits: Mucoperiosteal thickening and partial opacification
of the right maxillary sinus and right ethmoidal air cells. No fluid
levels.

Other:  The mastoid air cells are unopacified.
IMPRESSION: 1.  No evidence of acute intracranial abnormality.
2. Right paranasal sinusitis, probably chronic.

## 2018-04-26 IMAGING — CR DG CHEST 2V
2 series · 2 of 2 positions shown · non-contrast
Comparison: None.

CLINICAL DATA: Per mother last night approx 1900hrs pt became
incoherent, agitated, not acting right and this a.m continues to
have altered mental status, unresponsive to questions

EXAM:
CHEST  2 VIEW

[x chest ap]
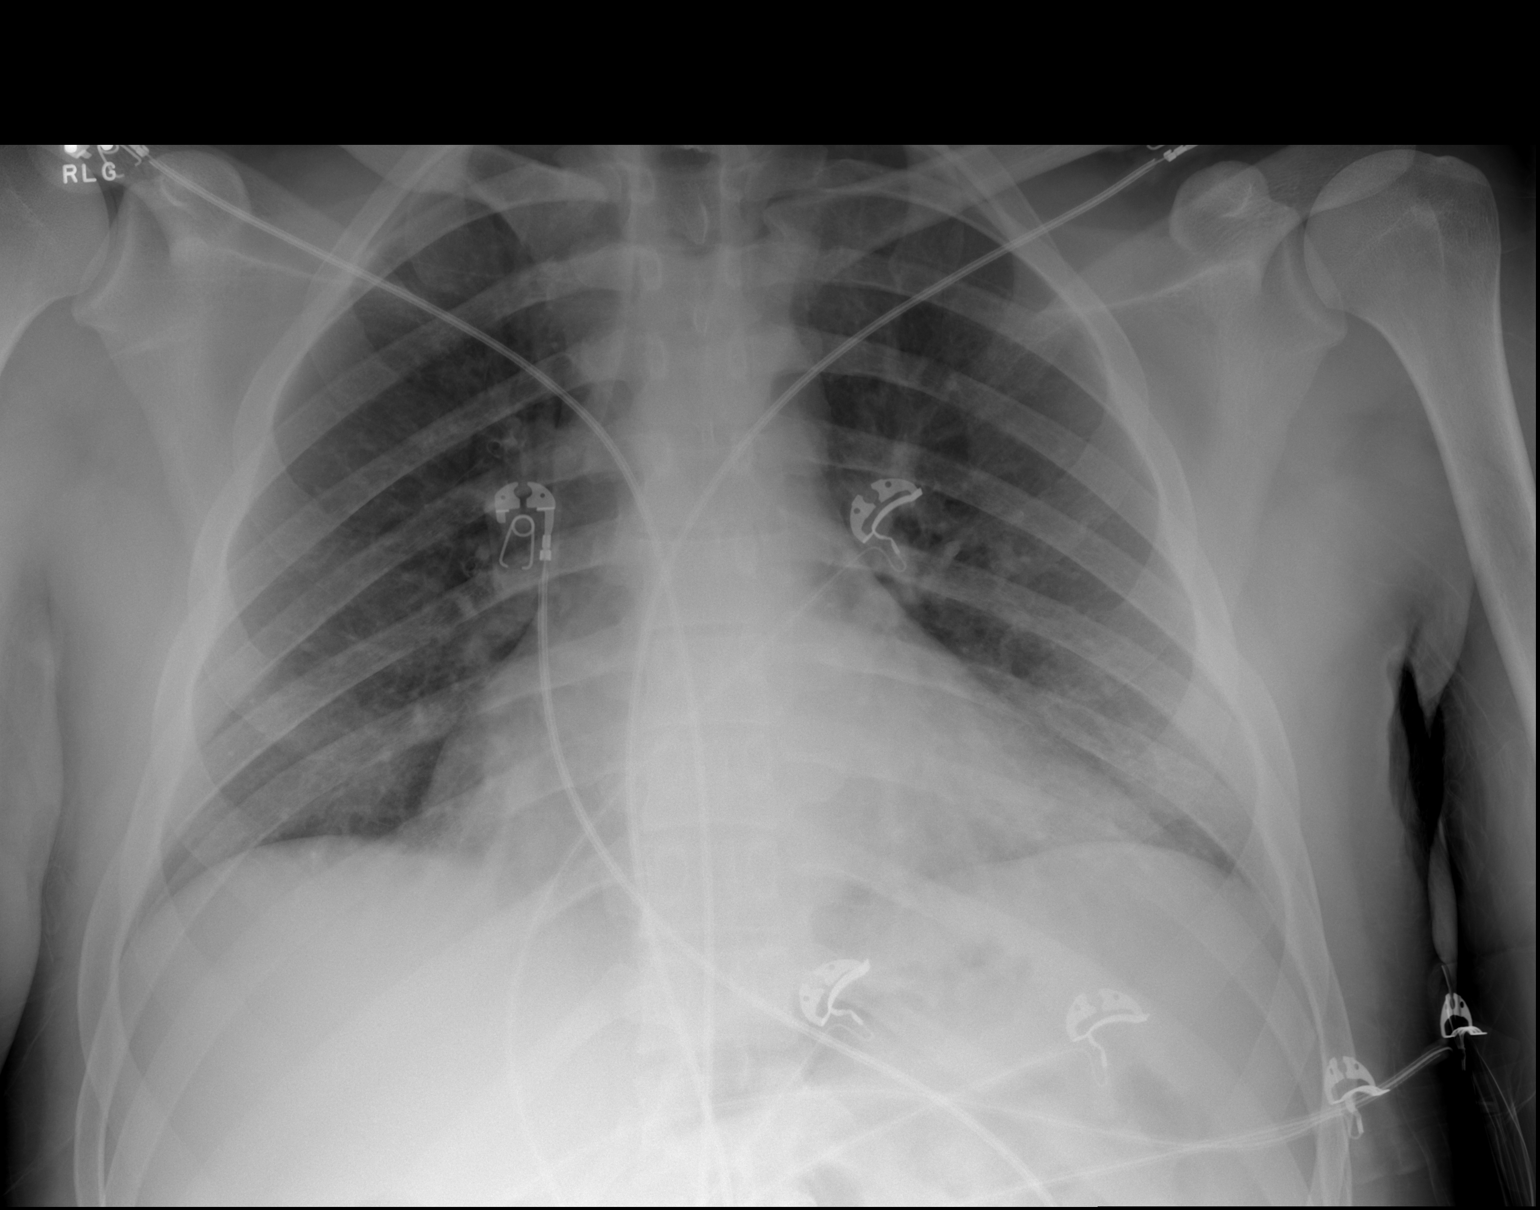

[w chest lat]
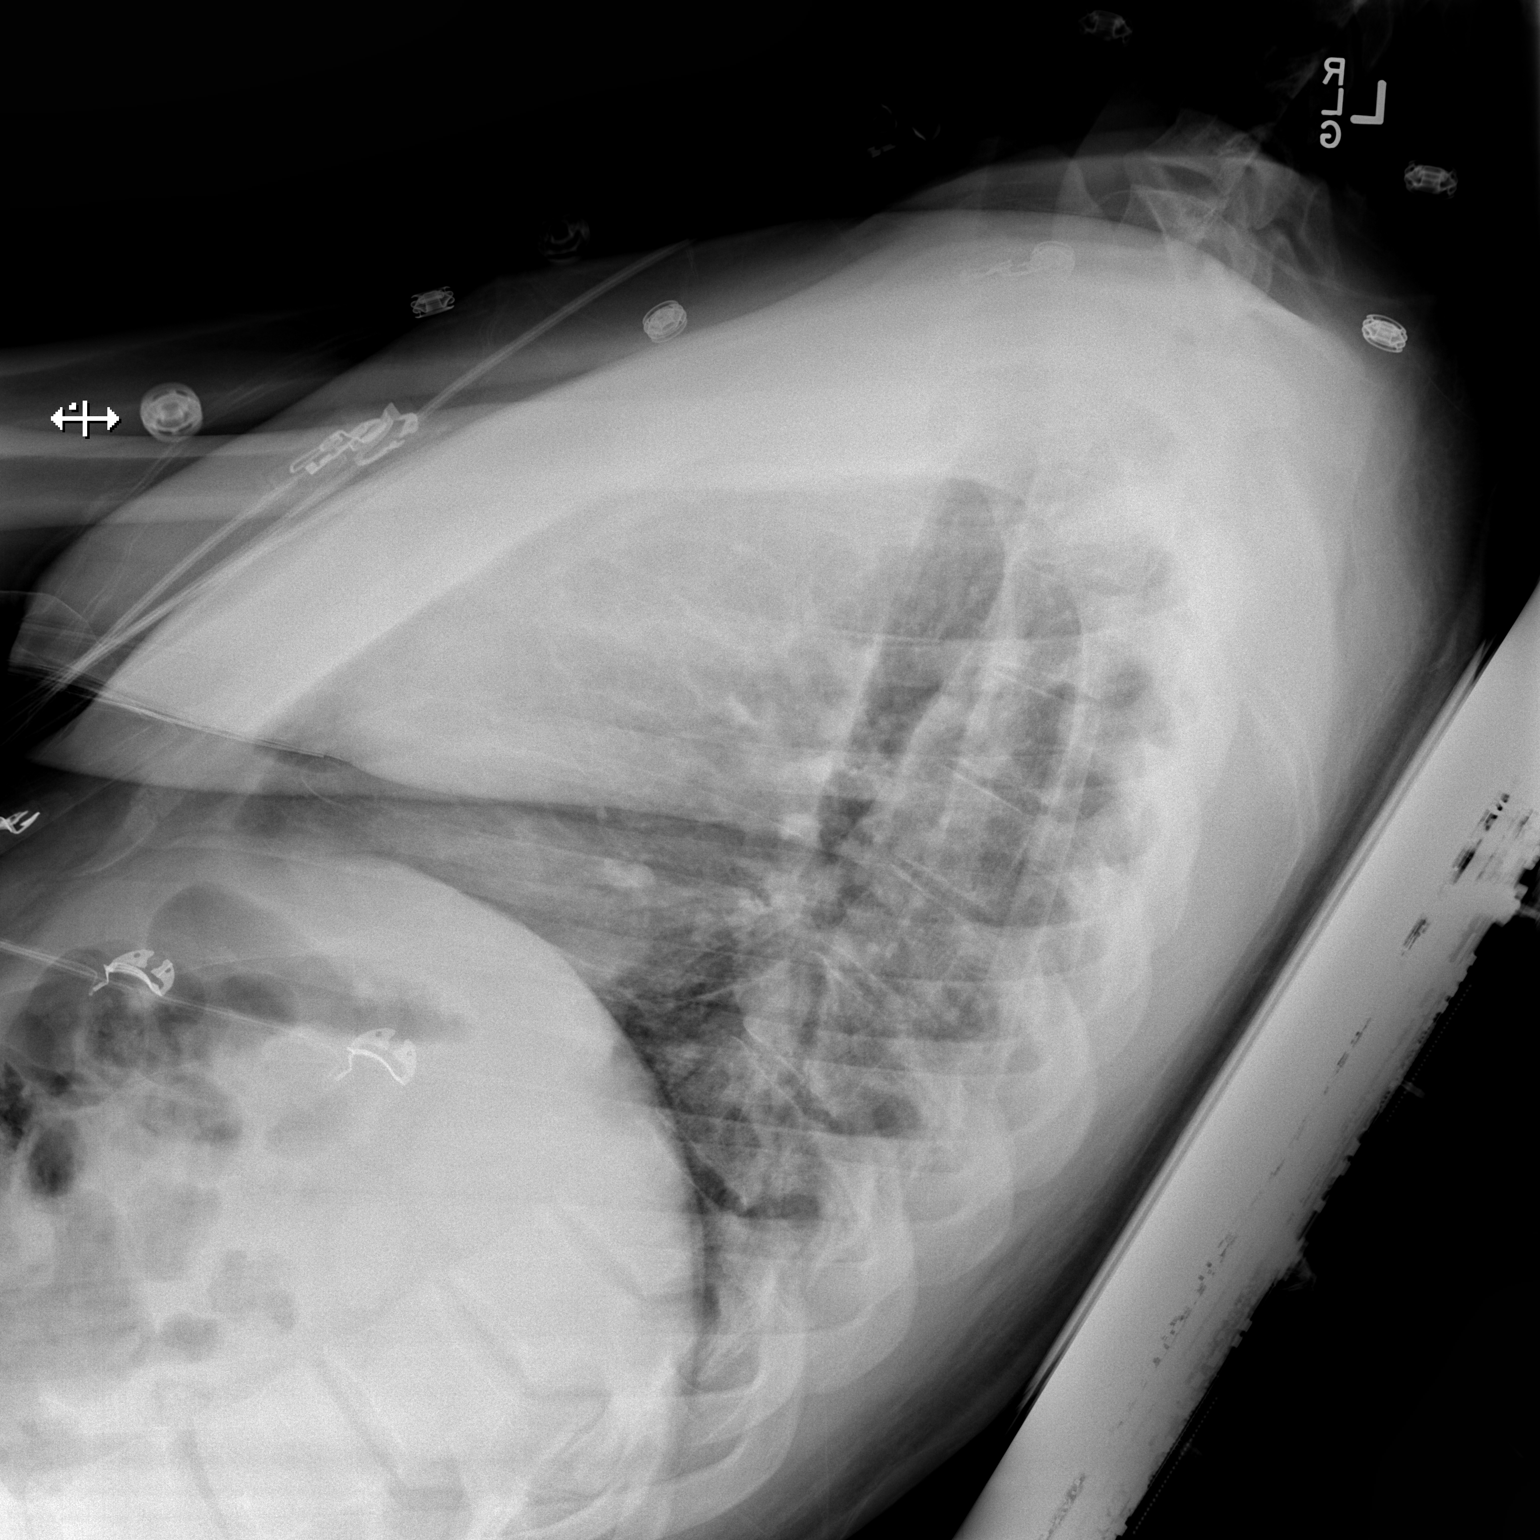

[2 of 2 positions shown; findings below may reference images not displayed]

FINDINGS: The heart size and mediastinal contours are within normal limits.
Both lungs are clear. The visualized skeletal structures are
unremarkable.
IMPRESSION: No active cardiopulmonary disease.

## 2018-10-12 ENCOUNTER — Other Ambulatory Visit: Payer: Self-pay | Admitting: Family Medicine

## 2018-10-12 DIAGNOSIS — N6310 Unspecified lump in the right breast, unspecified quadrant: Secondary | ICD-10-CM

## 2019-07-21 ENCOUNTER — Ambulatory Visit: Payer: Self-pay | Attending: Internal Medicine

## 2022-04-19 DIAGNOSIS — M25512 Pain in left shoulder: Secondary | ICD-10-CM | POA: Diagnosis not present

## 2022-04-19 DIAGNOSIS — M778 Other enthesopathies, not elsewhere classified: Secondary | ICD-10-CM | POA: Diagnosis not present
